# Patient Record
Sex: Male | Born: 1985 | Race: White | Hispanic: No | Marital: Married | Smoking: Never smoker
Health system: Southern US, Community
[De-identification: ages and names within clinical notes are randomized; demographics above are authoritative.]

## PROBLEM LIST (undated history)

## (undated) DIAGNOSIS — L511 Stevens-Johnson syndrome: Secondary | ICD-10-CM

## (undated) DIAGNOSIS — Z91013 Allergy to seafood: Secondary | ICD-10-CM

## (undated) DIAGNOSIS — T7840XA Allergy, unspecified, initial encounter: Secondary | ICD-10-CM

## (undated) HISTORY — DX: Allergy, unspecified, initial encounter: T78.40XA

---

## 2018-05-09 HISTORY — PX: VASECTOMY: SHX75

## 2020-05-03 ENCOUNTER — Ambulatory Visit: Payer: BC Managed Care – PPO | Admitting: Physician Assistant

## 2020-05-03 ENCOUNTER — Other Ambulatory Visit: Payer: Self-pay

## 2020-05-03 VITALS — BP 113/77 | HR 91 | Temp 98.3°F | Resp 18 | Ht 67.5 in | Wt 126.0 lb

## 2020-05-03 DIAGNOSIS — J302 Other seasonal allergic rhinitis: Secondary | ICD-10-CM | POA: Diagnosis not present

## 2020-05-03 DIAGNOSIS — R059 Cough, unspecified: Secondary | ICD-10-CM

## 2020-05-03 DIAGNOSIS — R05 Cough: Secondary | ICD-10-CM

## 2020-05-03 MED ORDER — BENZONATATE 100 MG PO CAPS: 100.0000 mg | ORAL_CAPSULE | Freq: Three times a day (TID) | ORAL | 0 refills | Status: AC | PRN

## 2020-05-03 NOTE — Patient Instructions (Signed)
I encourage you to use Zyrtec or Claritin over-the-counter on a daily basis, generic versions are fine.  Continue to use Flonase as needed, you can use it twice a day for the next few days.  I did send a refill of the Tessalon Perles to your pharmacy  Please let us know if there is anything else that we can do for you  Roney Jaffe, PA-C Physician Assistant [facility] [email or web address]    Allergies, Adult An allergy is when your body's defense system (immune system) overreacts to an otherwise harmless substance (allergen) that you breathe in or eat or something that touches your skin. When you come into contact with something that you are allergic to, your immune system produces certain proteins (antibodies). These proteins cause cells to release chemicals (histamines) that trigger the symptoms of an allergic reaction. Allergies often affect the nasal passages (allergic rhinitis), eyes (allergic conjunctivitis), skin (atopic dermatitis), and stomach. Allergies can be mild or severe. Allergies cannot spread from person to person (are not contagious). They can develop at any age and may be outgrown. What increases the risk? You may be at greater risk of allergies if other people in your family have allergies. What are the signs or symptoms? Symptoms depend on what type of allergy you have. They may include:  Runny, stuffy nose.  Sneezing.  Itchy mouth, ears, or throat.  Postnasal drip.  Sore throat.  Itchy, red, watery, or puffy eyes.  Skin rash or hives.  Stomach pain.  Vomiting.  Diarrhea.  Bloating.  Wheezing or coughing. People with a severe allergy to food, medicine, or an insect bite may have a life-threatening allergic reaction (anaphylaxis). Symptoms of anaphylaxis include:  Hives.  Itching.  Flushed face.  Swollen lips, tongue, or mouth.  Tight or swollen throat.  Chest pain or tightness in the  chest.  Trouble breathing or shortness of breath.  Rapid heartbeat.  Dizziness or fainting.  Vomiting.  Diarrhea.  Pain in the abdomen. How is this diagnosed? This condition is diagnosed based on:  Your symptoms.  Your family and medical history.  A physical exam. You may need to see a health care provider who specializes in treating allergies (allergist). You may also have tests, including:  Skin tests to see which allergens are causing your symptoms, such as: ? Skin prick test. In this test, your skin is pricked with a tiny needle and exposed to small amounts of possible allergens to see if your skin reacts. ? Intradermal skin test. In this test, a small amount of allergen is injected under your skin to see if your skin reacts. ? Patch test. In this test, a small amount of allergen is placed on your skin and then your skin is covered with a bandage. Your health care provider will check your skin after a couple of days to see if a rash has developed.  Blood tests.  Challenges tests. In this test, you inhale a small amount of allergen by mouth to see if you have an allergic reaction. You may also be asked to:  Keep a food diary. A food diary is a record of all the foods and drinks you have in a day and any symptoms you experience.  Practice an elimination diet. An elimination diet involves eliminating specific foods from your diet and then adding them back in one by one to find out if a certain food causes an allergic reaction. How is this treated? Treatment for allergies depends on your  symptoms. Treatment may include:  Cold compresses to soothe itching and swelling.  Eye drops.  Nasal sprays.  Using a saline spray or container (neti pot) to flush out the nose (nasal irrigation). These methods can help clear away mucus and keep the nasal passages moist.  Using a humidifier.  Oral antihistamines or other medicines to block allergic reaction and inflammation.  Skin  creams to treat rashes or itching.  Diet changes to eliminate food allergy triggers.  Repeated exposure to tiny amounts of allergens to build up a tolerance and prevent future allergic reactions (immunotherapy). These include: ? Allergy shots. ? Oral treatment. This involves taking small doses of an allergen under the tongue (sublingual immunotherapy).  Emergency epinephrine injection (auto-injector) in case of an allergic emergency. This is a self-injectable, pre-measured medicine that must be given within the first few minutes of a serious allergic reaction. Follow these instructions at home:         Avoid known allergens whenever possible.  If you suffer from airborne allergens, wash out your nose daily. You can do this with a saline spray or a neti pot to flush out your nose (nasal irrigation).  Take over-the-counter and prescription medicines only as told by your health care provider.  Keep all follow-up visits as told by your health care provider. This is important.  If you are at risk of a severe allergic reaction (anaphylaxis), keep your auto-injector with you at all times.  If you have ever had anaphylaxis, wear a medical alert bracelet or necklace that states you have a severe allergy. Contact a health care provider if:  Your symptoms do not improve with treatment. Get help right away if:  You have symptoms of anaphylaxis, such as: ? Swollen mouth, tongue, or throat. ? Pain or tightness in your chest. ? Trouble breathing or shortness of breath. ? Dizziness or fainting. ? Severe abdominal pain, vomiting, or diarrhea. This information is not intended to replace advice given to you by your health care provider. Make sure you discuss any questions you have with your health care provider. Document Revised: 11/04/2017 Document Reviewed: 02/27/2016 Elsevier Patient Education  2020 [employer].

## 2020-05-03 NOTE — Progress Notes (Signed)
Patient complains of intermittent cough returning after being treated for allergies the end of July. Patient shares the cough increases at night with laying down and is non productive.

## 2020-05-03 NOTE — Progress Notes (Signed)
New Patient Office Visit  Subjective:  Patient ID: David Sims, male    DOB: September 29, 1985  Age: 34 y.o. MRN: [id number]  CC:  Chief Complaint  Patient presents with  . Cough    HPI David Sims reports that he has been having a cough for the past two months, was seen in UC and given an inhaler, Prednisone and Tessalon perles with some relief.  Reports cough continues, is worse at night.  Non-productive.  Denies any other URI sxs.  Negative Covid test 04/24/20.  States that cough came back a couple of days ago Has been using flonase.  Did take Zyrtec for a little while with some relief. Recently moved from [place] , stayed in a camp [place] about a week in [facility]  March 2021 Moderna fully vaccinated  History reviewed. No pertinent past medical history.  History reviewed. No pertinent surgical history.  History reviewed. No pertinent family history.  Social History   Socioeconomic History  . Marital status: Unknown    Spouse name: Not on file  . Number of children: Not on file  . Years of education: Not on file  . Highest education level: Not on file  Occupational History  . Not on file  Tobacco Use  . Smoking status: Never Smoker  . Smokeless tobacco: Never Used  Substance and Sexual Activity  . Alcohol use: Not on file  . Drug use: Not on file  . Sexual activity: Not on file  Other Topics Concern  . Not on file  Social History Narrative  . Not on file   Social Determinants of Health   Financial Resource Strain:   . Difficulty of Paying Living Expenses: Not on file  Food Insecurity:   . Worried About Programme researcher, broadcasting/film/video in the Last Year: Not on file  . Ran Out of Food in the Last Year: Not on file  Transportation Needs:   . Lack of Transportation (Medical): Not on file  . Lack of Transportation (Non-Medical): Not on file  Physical Activity:   . Days of Exercise per Week: Not on file  . Minutes of Exercise per Session: Not on file  Stress:   . Feeling of Stress : Not  on file  Social Connections:   . Frequency of Communication with Friends and Family: Not on file  . Frequency of Social Gatherings with Friends and Family: Not on file  . Attends Religious Services: Not on file  . Active Member of Clubs or Organizations: Not on file  . Attends Banker Meetings: Not on file  . Marital Status: Not on file  Intimate Partner Violence:   . Fear of Current or Ex-Partner: Not on file  . Emotionally Abused: Not on file  . Physically Abused: Not on file  . Sexually Abused: Not on file    ROS Review of Systems  Constitutional: Negative for chills, fatigue and fever.  HENT: Positive for postnasal drip. Negative for congestion, sinus pressure, sinus pain and sore throat.   Eyes: Negative for itching.  Respiratory: Positive for cough. Negative for shortness of breath and wheezing.   Cardiovascular: Negative.   Gastrointestinal: Negative.   Endocrine: Negative.   Genitourinary: Negative.   Musculoskeletal: Negative.   Skin: Negative.   Allergic/Immunologic: Negative.   Neurological: Negative.   Hematological: Negative.   Psychiatric/Behavioral: Negative.     Objective:   Today's Vitals: BP 113/77 (BP Location: Left Arm, Patient Position: Sitting, Cuff Size: Normal)   Pulse 91  Temp 98.3 F (36.8 C) (Oral)   Resp 18   Ht 5' 7.5" (1.715 m)   Wt 126 lb (57.2 kg)   SpO2 100%   BMI 19.44 kg/m   Physical Exam Vitals and nursing note reviewed.  Constitutional:      Appearance: Normal appearance.  HENT:     Head: Normocephalic.     Right Ear: Hearing, tympanic membrane, ear canal and external ear normal.     Left Ear: Hearing, tympanic membrane, ear canal and external ear normal.     Nose:     Right Turbinates: Enlarged and pale.     Left Turbinates: Enlarged and pale.     Right Sinus: No maxillary sinus tenderness or frontal sinus tenderness.     Left Sinus: No maxillary sinus tenderness or frontal sinus tenderness.      Mouth/Throat:     Lips: Pink.     Mouth: Mucous membranes are moist.     Pharynx: Uvula swelling present. No posterior oropharyngeal erythema.   Cardiovascular:     Pulses: Normal pulses.     Heart sounds: Normal heart sounds.  Pulmonary:     Effort: Pulmonary effort is normal.     Breath sounds: Normal breath sounds. No wheezing.  Musculoskeletal:        General: Normal range of motion.     Cervical back: Normal range of motion and neck supple.  Lymphadenopathy:     Cervical: No cervical adenopathy.  Skin:    General: Skin is warm and dry.  Neurological:     General: No focal deficit present.     Mental Status: He is alert and oriented to person, place, and time.  Psychiatric:        Mood and Affect: Mood normal.        Behavior: Behavior normal.        Thought Content: Thought content normal.        Judgment: Judgment normal.     Assessment & Plan:   Problem List Items Addressed This Visit    None    Visit Diagnoses    Cough    -  Primary   Seasonal allergies          Outpatient Encounter Medications as of 05/03/2020  Medication Sig  . albuterol (VENTOLIN HFA) 108 (90 Base) MCG/ACT inhaler SMARTSIG:2 Puff(s) By Mouth Every Night PRN  . benzonatate (TESSALON) 100 MG capsule Take 1 capsule (100 mg total) by mouth 3 (three) times daily as needed for up to 10 days.  . valACYclovir (VALTREX) 1000 MG tablet Take 2,000 mg by mouth 2 (two) times daily.  . [DISCONTINUED] benzonatate (TESSALON) 100 MG capsule Take 100 mg by mouth 3 (three) times daily as needed.  . [DISCONTINUED] predniSONE (DELTASONE) 20 MG tablet Take 20 mg by mouth 2 (two) times daily.   No facility-administered encounter medications on file as of 05/03/2020.  1. Cough Refilled Tessalon Perles, encourage patient to take Zyrtec or Claritin over-the-counter on a daily basis, continue Flonase as needed.  Red flags given for prompt evaluation at ED.  2. Seasonal allergies    I have reviewed the patient's  medical history (PMH, PSH, Social History, Family History, Medications, and allergies) , and have been updated if relevant. I spent 30 minutes reviewing chart and  face to face time with patient.      Follow-up: Return if symptoms worsen or fail to improve.   Kasandra Knudsen Mayers, PA-C

## 2020-05-16 ENCOUNTER — Emergency Department (INDEPENDENT_AMBULATORY_CARE_PROVIDER_SITE_OTHER): Payer: BC Managed Care – PPO

## 2020-05-16 ENCOUNTER — Other Ambulatory Visit: Payer: Self-pay

## 2020-05-16 ENCOUNTER — Emergency Department (INDEPENDENT_AMBULATORY_CARE_PROVIDER_SITE_OTHER)
Admission: RE | Admit: 2020-05-16 | Discharge: 2020-05-16 | Disposition: A | Discharge status: Home / Self Care | Payer: BC Managed Care – PPO | Source: Ambulatory Visit | Attending: Family Medicine | Admitting: Family Medicine

## 2020-05-16 VITALS — BP 122/83 | HR 94 | Temp 98.6°F | Resp 16 | Ht 67.0 in | Wt 125.0 lb

## 2020-05-16 DIAGNOSIS — J069 Acute upper respiratory infection, unspecified: Secondary | ICD-10-CM | POA: Diagnosis not present

## 2020-05-16 DIAGNOSIS — R05 Cough: Secondary | ICD-10-CM | POA: Diagnosis not present

## 2020-05-16 HISTORY — DX: Allergy to seafood: Z91.013

## 2020-05-16 HISTORY — DX: Stevens-Johnson syndrome: L51.1

## 2020-05-16 MED ORDER — PREDNISONE 20 MG PO TABS: ORAL_TABLET | ORAL | 0 refills | Status: DC

## 2020-05-16 NOTE — ED Provider Notes (Signed)
[facility]    CSN: [id number] Arrival date & time: 05/16/20  0954      History   Chief Complaint Chief Complaint  Patient presents with  . Cough    HPI David Sims is a 34 y.o. male.   Patient has environmental allergies and reports that he has had a persistent cough for 2 months after moving here from [place].  His symptoms are usually somewhat controlled with Zyrtec, Claritin, and Flonase. Last night he developed sore throat, nasal drainage, and tightness in his anterior chest.  He denies fever and shortness of breath. He had the Moderna COVID vaccine 3/21.  The history is provided by the patient.    Past Medical History:  Diagnosis Date  . Seafood allergy   . Stevens-Johnson syndrome (HCC)    34 years old    There are no problems to display for this patient.   History reviewed. No pertinent surgical history.     Home Medications    Prior to Admission medications   Medication Sig Start Date End Date Taking? Authorizing Provider  cetirizine (ZYRTEC) 10 MG tablet Take 10 mg by mouth daily.   Yes [provider]  fluticasone (FLONASE) 50 MCG/ACT nasal spray Place into both nostrils daily.   Yes [provider]  albuterol (VENTOLIN HFA) 108 (90 Base) MCG/ACT inhaler SMARTSIG:2 Puff(s) By Mouth Every Night PRN 03/24/20   [provider]  predniSONE (DELTASONE) 20 MG tablet Take one tab by mouth twice daily for 4 days, then one daily for 3 days. Take with food. 05/16/20   Lattie Haw, MD  valACYclovir (VALTREX) 1000 MG tablet Take 2,000 mg by mouth 2 (two) times daily. 03/24/20   [provider]    Family History Family History  Problem Relation Age of Onset  . Hypertension Mother   . Cancer Paternal Grandmother        liver    Social History Social History   Tobacco Use  . Smoking status: Never Smoker  . Smokeless tobacco: Never Used  Vaping Use  . Vaping Use: Never used  Substance Use Topics  .  Alcohol use: Never  . Drug use: Never     Allergies   Penicillins   Review of Systems Review of Systems + sore throat + cough No pleuritic pain but has tightness in his anterior chest. No wheezing + nasal congestion + post-nasal drainage No sinus pain/pressure No itchy/red eyes No earache No hemoptysis No SOB No fever/chills No nausea No vomiting No abdominal pain No diarrhea No urinary symptoms No skin rash No fatigue No myalgias No headache Used OTC meds (Mucinex syrup) without relief   Physical Exam Triage Vital Signs ED Triage Vitals  Enc Vitals Group     BP 05/16/20 1014 122/83     Pulse Rate 05/16/20 1014 94     Resp 05/16/20 1014 16     Temp 05/16/20 1014 98.6 F (37 C)     Temp Source 05/16/20 1014 Oral     SpO2 05/16/20 1014 100 %     Weight 05/16/20 1010 125 lb (56.7 kg)     Height 05/16/20 1010 5\' 7"  (1.702 m)     Head Circumference --      Peak Flow --      Pain Score 05/16/20 1010 0     Pain Loc --      Pain Edu? --      Excl. in GC? --    No data  found.  Updated Vital Signs BP 122/83 (BP Location: Right Arm)   Pulse 94   Temp 98.6 F (37 C) (Oral)   Resp 16   Ht 5\' 7"  (1.702 m)   Wt 56.7 kg   SpO2 100%   BMI 19.58 kg/m   Visual Acuity Right Eye Distance:   Left Eye Distance:   Bilateral Distance:    Right Eye Near:   Left Eye Near:    Bilateral Near:     Physical Exam Nursing notes and Vital Signs reviewed. Appearance:  Patient appears stated age, and in no acute distress Eyes:  Pupils are equal, round, and reactive to light and accomodation.  Extraocular movement is intact.  Conjunctivae are not inflamed  Ears:  Canals normal.  Tympanic membranes normal.  Nose:  Mildly congested turbinates.  No sinus tenderness.  Pharynx:  Normal Neck:  Supple.  Mildly enlarged lateral nodes are present, tender to palpation on the left.   Lungs:  Clear to auscultation.  Breath sounds are equal.  Moving air well. Heart:  Regular rate  and rhythm without murmurs, rubs, or gallops.  Abdomen:  Nontender without masses or hepatosplenomegaly.  Bowel sounds are present.  No CVA or flank tenderness.  Extremities:  No edema.  Skin:  No rash present.   UC Treatments / Results  Labs (all labs ordered are listed, but only abnormal results are displayed) Labs Reviewed - No data to display  EKG   Radiology DG Chest 2 View  Result Date: 05/16/2020 CLINICAL DATA:  Cough and allergies for 2 months. EXAM: CHEST - 2 VIEW COMPARISON:  None. FINDINGS: Normal heart size and mediastinal contours. No acute infiltrate or edema. No effusion or pneumothorax. No acute osseous findings. IMPRESSION: No active cardiopulmonary disease. Electronically Signed   By: 05/18/2020 M.D.   On: 05/16/2020 11:37    Procedures Procedures (including critical care time)  Medications Ordered in UC Medications - No data to display  Initial Impression / Assessment and Plan / UC Course  I have reviewed the triage vital signs and the nursing notes.  Pertinent labs & imaging results that were available during my care of the patient were reviewed by me and considered in my medical decision making (see chart for details).    Negative chest x-ray reassuring. Benign exam.  There is no evidence of bacterial infection today.  Treat symptomatically for now  Begin prednisone burst/taper. Followup with Family Doctor if not improved in 7 to 10 days.   Final Clinical Impressions(s) / UC Diagnoses   Final diagnoses:  Viral URI with cough     Discharge Instructions     Take plain guaifenesin (1200mg  extended release tabs such as Mucinex) twice daily, with plenty of water, for cough and congestion.  May add Pseudoephedrine (30mg , one or two every 4 to 6 hours) for sinus congestion.  Get adequate rest.   May use Afrin nasal spray (or generic oxymetazoline) each morning for about 5 days and then discontinue.  Also recommend using saline nasal spray several times  daily and saline nasal irrigation (AYR is a common brand).  Use Flonase nasal spray each morning after using Afrin nasal spray and saline nasal irrigation. Try warm salt water gargles for sore throat.  Stop all antihistamines (Zyrtec, etc) for now, and other non-prescription cough/cold preparations. May take Tylenol as needed for sore throat, body aches, etc. May take Delsym Cough Suppressant ("12 Hour Cough Relief") at bedtime for nighttime cough.  ED Prescriptions    Medication Sig Dispense Auth. Provider   predniSONE (DELTASONE) 20 MG tablet Take one tab by mouth twice daily for 4 days, then one daily for 3 days. Take with food. 11 tablet Lattie Haw, MD        Lattie Haw, MD 05/19/20 [phone]

## 2020-05-16 NOTE — ED Triage Notes (Addendum)
Pt c/o cough and allergies x 2 months after moving here recently from [place]. Taking Zyrtec and flonase. Moderna Covid vaccine 3/21'.

## 2020-05-16 NOTE — Discharge Instructions (Addendum)
Take plain guaifenesin (1200mg  extended release tabs such as Mucinex) twice daily, with plenty of water, for cough and congestion.  May add Pseudoephedrine (30mg , one or two every 4 to 6 hours) for sinus congestion.  Get adequate rest.   May use Afrin nasal spray (or generic oxymetazoline) each morning for about 5 days and then discontinue.  Also recommend using saline nasal spray several times daily and saline nasal irrigation (AYR is a common brand).  Use Flonase nasal spray each morning after using Afrin nasal spray and saline nasal irrigation. Try warm salt water gargles for sore throat.  Stop all antihistamines (Zyrtec, etc) for now, and other non-prescription cough/cold preparations. May take Tylenol as needed for sore throat, body aches, etc. May take Delsym Cough Suppressant ("12 Hour Cough Relief") at bedtime for nighttime cough.

## 2020-10-10 ENCOUNTER — Ambulatory Visit: Payer: BC Managed Care – PPO | Admitting: Nurse Practitioner

## 2020-10-15 ENCOUNTER — Other Ambulatory Visit: Payer: Self-pay

## 2020-10-16 ENCOUNTER — Encounter: Payer: Self-pay | Admitting: Family Medicine

## 2020-10-16 ENCOUNTER — Ambulatory Visit (INDEPENDENT_AMBULATORY_CARE_PROVIDER_SITE_OTHER): Payer: BC Managed Care – PPO | Admitting: Family Medicine

## 2020-10-16 VITALS — BP 116/78 | HR 95 | Temp 97.0°F | Ht 68.0 in | Wt 129.2 lb

## 2020-10-16 DIAGNOSIS — Z1321 Encounter for screening for nutritional disorder: Secondary | ICD-10-CM | POA: Diagnosis not present

## 2020-10-16 DIAGNOSIS — Z13220 Encounter for screening for lipoid disorders: Secondary | ICD-10-CM | POA: Diagnosis not present

## 2020-10-16 DIAGNOSIS — J302 Other seasonal allergic rhinitis: Secondary | ICD-10-CM | POA: Diagnosis not present

## 2020-10-16 DIAGNOSIS — Z87442 Personal history of urinary calculi: Secondary | ICD-10-CM | POA: Insufficient documentation

## 2020-10-16 DIAGNOSIS — E559 Vitamin D deficiency, unspecified: Secondary | ICD-10-CM

## 2020-10-16 DIAGNOSIS — Z029 Encounter for administrative examinations, unspecified: Secondary | ICD-10-CM

## 2020-10-16 DIAGNOSIS — Z8619 Personal history of other infectious and parasitic diseases: Secondary | ICD-10-CM

## 2020-10-16 DIAGNOSIS — J45909 Unspecified asthma, uncomplicated: Secondary | ICD-10-CM | POA: Diagnosis not present

## 2020-10-16 NOTE — Progress Notes (Addendum)
[facility] [address] [place] [address] Dept: [phone] Dept Fax: [phone]  New Patient Office Visit  Subjective:    Patient ID: David Sims, male    DOB: [birth year], 35 y.o..   MRN: [id number]  Chief Complaint  Patient presents with  . Establish Care    New patient/establish care, no concerns. CPE    History of Present Illness:  Patient is in today to establish care. Mr. Petrich serves in the [employer]. He is married with 3 children (7,4,2 yo). He moved to [facility] form [place] last June. In addition to establishing care, he needed to have a physical form completed for the [employer].  Mr. Hofman notes that after he moved here last June, he suddenly developed intense allergy symptoms. He had not had these previously. He was treated with an albuterol inhaler, an oral course of steroids, cetrizine, and Flonase. He is now asymptomatic, but has the meds (other than prednisone) available should he flare.  Mr. Dworkin notes he has issues with a low Vit. D in the past. He states he gets reactions to sunlight, usually intense burns, so uses sunscreen liberally. He feels this has contributed to his Vit. D issue.  Past Medical History: Patient Active Problem List   Diagnosis Date Noted  . Seasonal allergies 10/16/2020  . Asthma due to seasonal allergies 10/16/2020  . History of kidney stones 10/16/2020  . History of shingles 10/16/2020   Past Surgical History:  Procedure Laterality Date  . VASECTOMY  05/09/2018   Family History  Problem Relation Age of Onset  . Hypertension Mother   . Cancer Paternal Grandmother        liver  . Dementia Paternal Grandmother   . Cancer Maternal Grandmother   . Heart disease Maternal Grandfather   . Heart disease Paternal Grandfather    Outpatient Medications Prior to Visit  Medication Sig Dispense Refill  . albuterol (VENTOLIN HFA) 108 (90 Base) MCG/ACT inhaler  SMARTSIG:2 Puff(s) By Mouth Every Night PRN    . cetirizine (ZYRTEC) 10 MG tablet Take 10 mg by mouth daily.    . fluticasone (FLONASE) 50 MCG/ACT nasal spray Place into both nostrils daily.    . valACYclovir (VALTREX) 1000 MG tablet Take 2,000 mg by mouth 2 (two) times daily. (Patient not taking: Reported on 10/16/2020)    . predniSONE (DELTASONE) 20 MG tablet Take one tab by mouth twice daily for 4 days, then one daily for 3 days. Take with food. (Patient not taking: Reported on 10/16/2020) 11 tablet 0   No facility-administered medications prior to visit.   Allergies  Allergen Reactions  . Penicillins    Objective:   Today's Vitals   10/16/20 1514  BP: 116/78  Pulse: 95  Temp: (!) 97 F (36.1 C)  TempSrc: Temporal  SpO2: 95%  Weight: 129 lb 3.2 oz (58.6 kg)  Height: 5\' 8"  (1.727 m)   Body mass index is 19.64 kg/m.   General: Well developed, well nourished. No acute distress. HEENT: Normocephalic, non-traumatic. PERRL, EOMI. Conjunctiva clear. Fundiscopic exam shows   normal disc and vasculature. External ears normal. EAC and TMs normal bilaterally. Nose clear without congestion or rhinorrhea. Mucous membranes moist. Oropharynx clear. Good dentition. Neck: Supple. No lymphadenopathy. No thyromegaly. Lungs: Clear to auscultation bilaterally. CV: RRR without murmurs or rubs. Pulses 2+ bilaterally. Abdomen: Soft, non-tender. No hepatosplenomegaly. No rebound or guarding. Extremities: Full ROM. No joint swelling or tenderness. No edema noted. Skin: Warm  and dry. No rashes. Psych: Alert and oriented. Normal mood and affect.  Health Maintenance Due  Topic Date Due  . Hepatitis C Screening  Never done  . HIV Screening  Never done     Assessment & Plan:   1. Seasonal allergies We discussed that Mr. Pretty may have a seasonal flare again in the late spring. He could opt either to await symptoms to restart cetirizine and Flonase or he could start using these earlier in May on  through to July.  2. Asthma due to seasonal allergies No wheezing at present. May use albuterol if allergies flare again this year.  3. Screening for lipid disorders  - Lipid panel  4. Encounter for vitamin deficiency screening  - VITAMIN D 25 Hydroxy (Vit-D Deficiency, Fractures)  5. History of shingles Notes this happened twice in the past. He was previously prescribed Valtrex for these flares. Will monitor for now.  Loyola Mast, MD

## 2020-10-17 ENCOUNTER — Encounter: Payer: Self-pay | Admitting: Family Medicine

## 2020-10-17 DIAGNOSIS — E559 Vitamin D deficiency, unspecified: Secondary | ICD-10-CM | POA: Insufficient documentation

## 2020-10-17 LAB — LIPID PANEL
Cholesterol: 200 mg/dL (ref 0–200)
HDL: 64.1 mg/dL (ref >39.00)
LDL Cholesterol: 125 mg/dL — ABNORMAL HIGH (ref 0–99)
NonHDL: 135.47
Total CHOL/HDL Ratio: 3
Triglycerides: 50 mg/dL (ref 0.0–149.0)
VLDL: 10 mg/dL (ref 0.0–40.0)

## 2020-10-17 LAB — VITAMIN D 25 HYDROXY (VIT D DEFICIENCY, FRACTURES): VITD: 21.33 ng/mL — ABNORMAL LOW (ref 30.00–100.00)

## 2020-10-17 MED ORDER — VITAMIN D (ERGOCALCIFEROL) 1.25 MG (50000 UNIT) PO CAPS
50000.0000 [IU] | ORAL_CAPSULE | ORAL | 0 refills | Status: DC
Start: 2020-10-17 — End: 2021-04-23

## 2021-04-15 ENCOUNTER — Encounter: Payer: Self-pay | Admitting: Family Medicine

## 2021-04-20 IMAGING — DX DG CHEST 2V
2 series · 2 of 2 positions shown · non-contrast
Comparison: None.

CLINICAL DATA: Cough and allergies for 2 months.

EXAM:
CHEST - 2 VIEW

[chest pa]
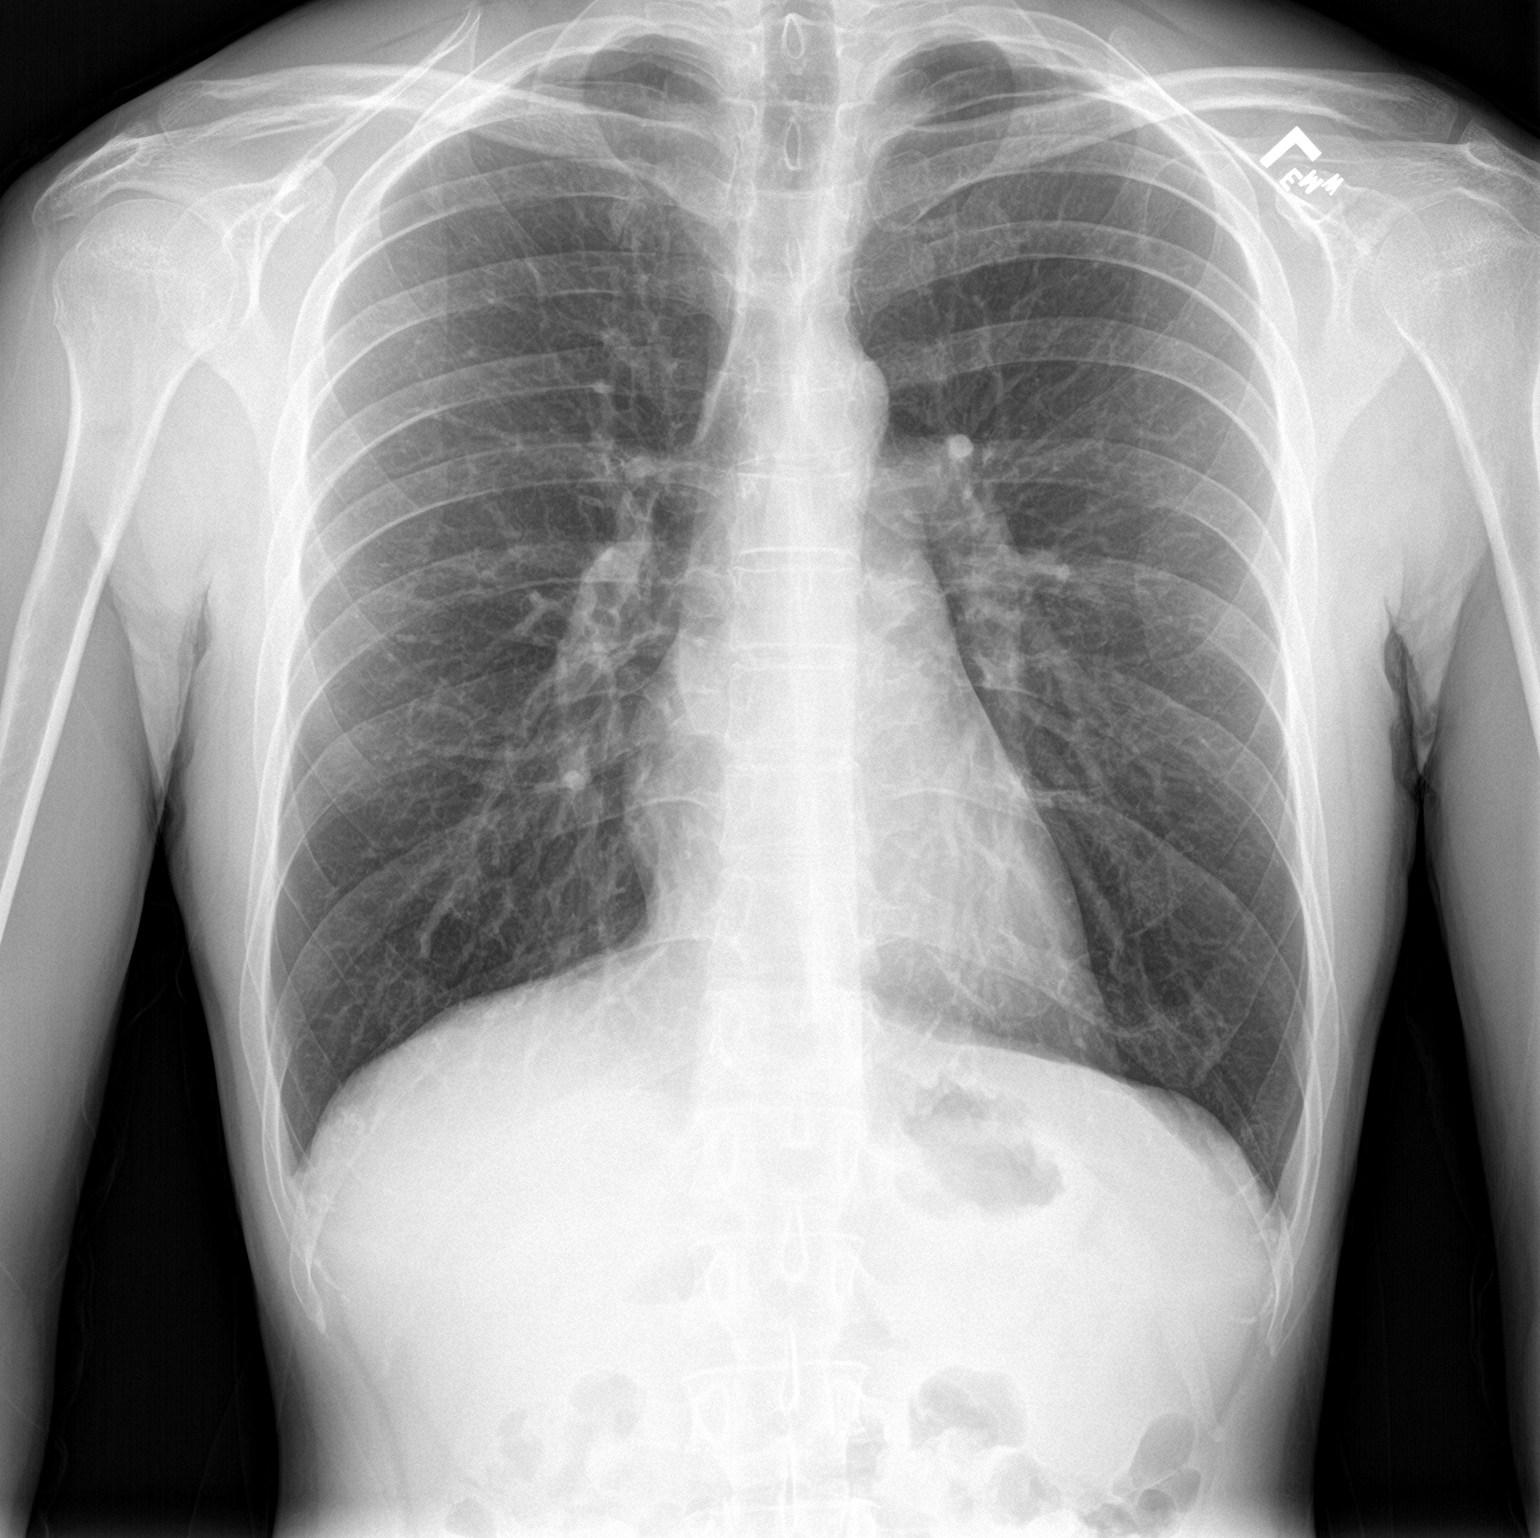

[chest lat]
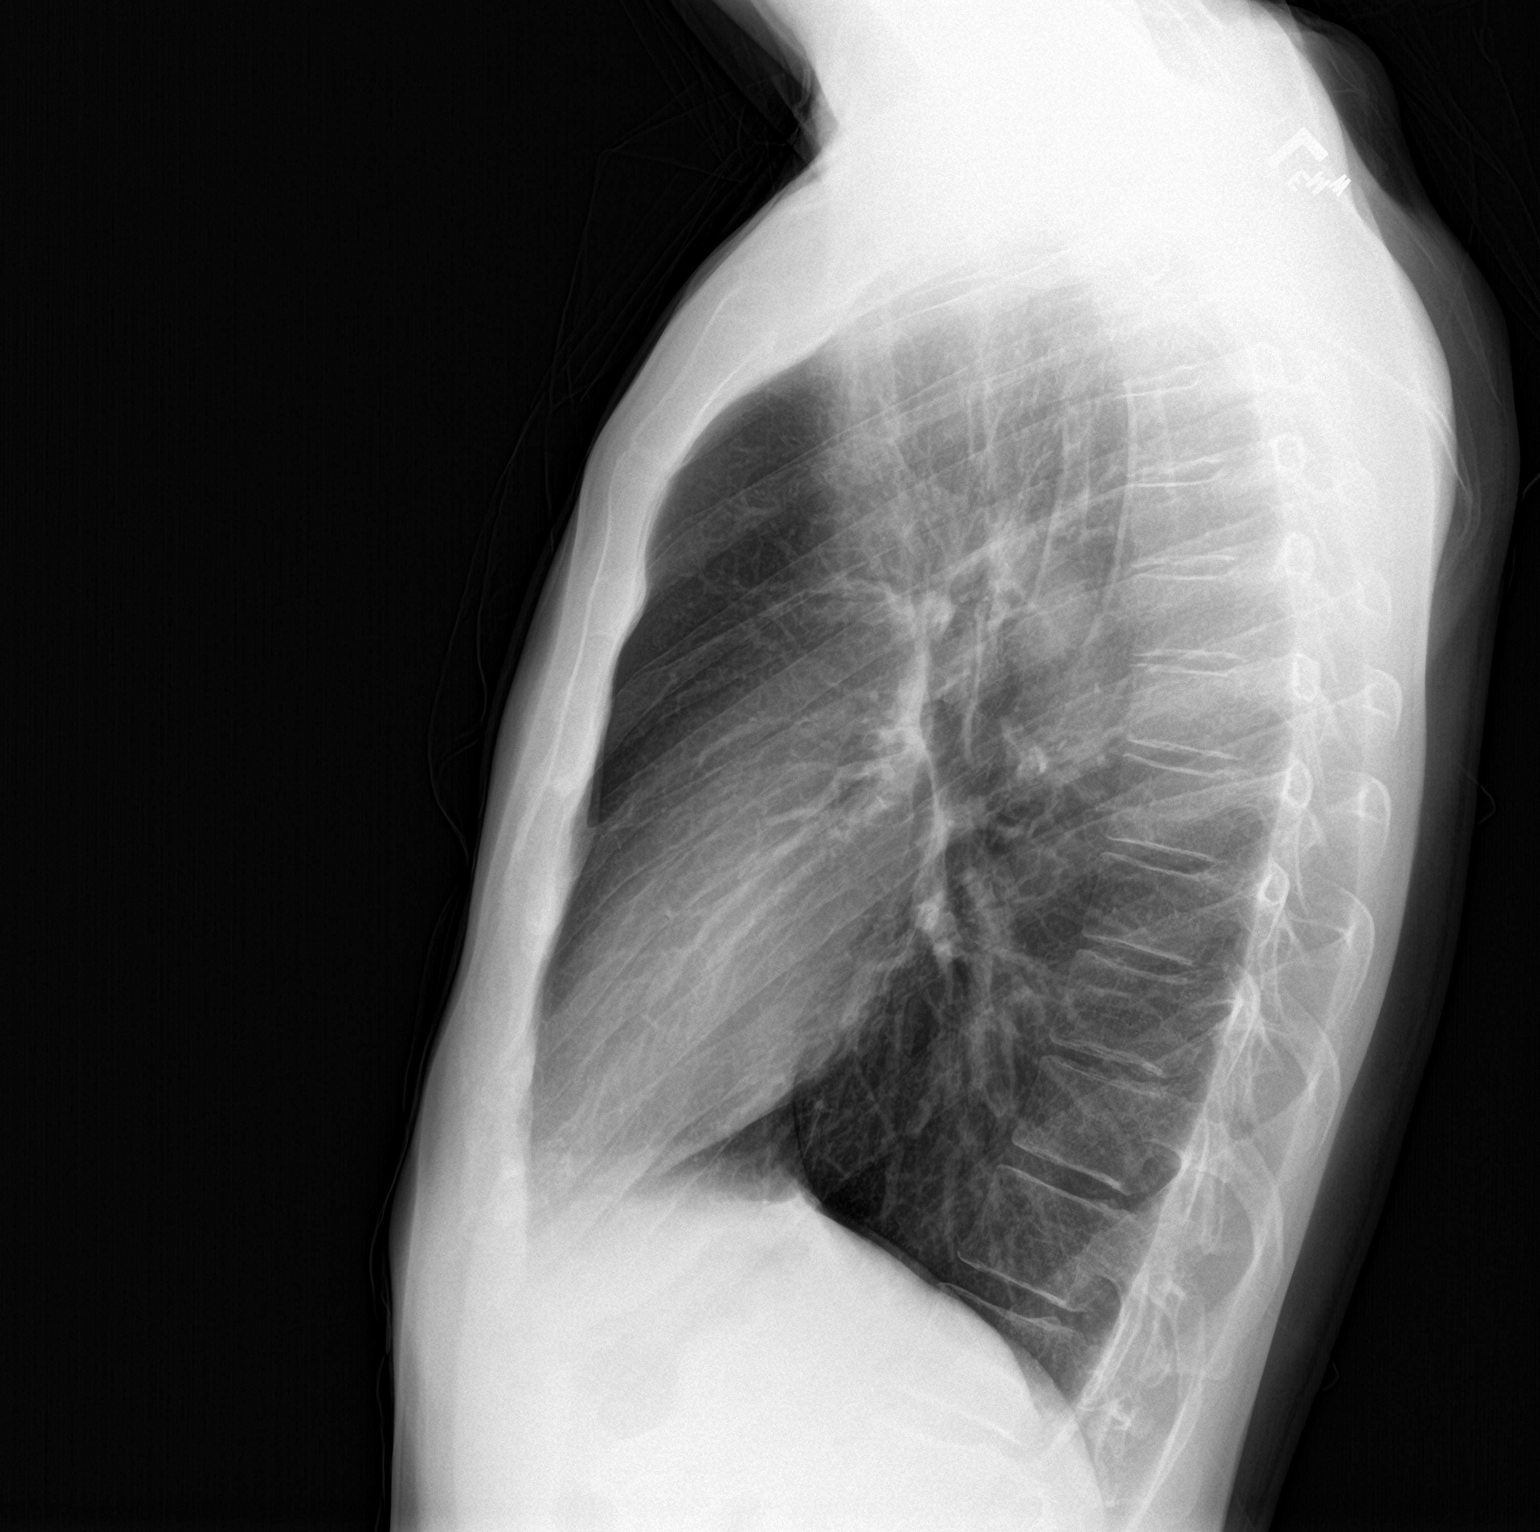

[2 of 2 positions shown; findings below may reference images not displayed]

FINDINGS: Normal heart size and mediastinal contours. No acute infiltrate or
edema. No effusion or pneumothorax. No acute osseous findings.
IMPRESSION: No active cardiopulmonary disease.

## 2021-04-23 ENCOUNTER — Ambulatory Visit (INDEPENDENT_AMBULATORY_CARE_PROVIDER_SITE_OTHER): Payer: BC Managed Care – PPO | Admitting: Family Medicine

## 2021-04-23 ENCOUNTER — Encounter: Payer: Self-pay | Admitting: Family Medicine

## 2021-04-23 ENCOUNTER — Other Ambulatory Visit: Payer: Self-pay

## 2021-04-23 VITALS — BP 106/68 | HR 83 | Temp 97.7°F | Ht 68.0 in | Wt 128.2 lb

## 2021-04-23 DIAGNOSIS — H93233 Hyperacusis, bilateral: Secondary | ICD-10-CM

## 2021-04-23 DIAGNOSIS — H6983 Other specified disorders of Eustachian tube, bilateral: Secondary | ICD-10-CM

## 2021-04-23 MED ORDER — FLUTICASONE PROPIONATE 50 MCG/ACT NA SUSP: 1.0000 | Freq: Two times a day (BID) | NASAL | 1 refills | Status: DC

## 2021-04-23 NOTE — Progress Notes (Signed)
[facility] [address] [place] [address] Dept: [phone] Dept Fax: [phone]  Office Visit  Subjective:    Patient ID: David Sims, male    DOB: [birth year], 35 y.o..   MRN: [id number]  Chief Complaint  Patient presents with   Hearing Problem    Pt c/o hearing sensitivity, along with headache x 2 years    History of Present Illness:  Patient is in today with a complaint of gradually worsening hearing issues. He notes he has been having a recurring issue with headache associated with loud noises. He has young girls at home and finds that their cries or shrieks can cause an immediate sharp pain int he head, which is relieved when he is away from the noise. He has also more recently noted that conversational sounds are muffled. He denies any dizziness. He has some intermittent tinnitus, which has not been bothersome. He has had occasional rhinitis symptoms, though these are typically worse in the spring. He has used antihistamines and nasal steroid sprays in the past, but none currently.  Past Medical History: Patient Active Problem List   Diagnosis Date Noted   Vitamin D deficiency 10/17/2020   Seasonal allergies 10/16/2020   Asthma due to seasonal allergies 10/16/2020   History of kidney stones 10/16/2020   History of shingles 10/16/2020   Past Surgical History:  Procedure Laterality Date   VASECTOMY  05/09/2018   Family History  Problem Relation Age of Onset   Hypertension Mother    Cancer Paternal Grandmother        liver   Dementia Paternal Grandmother    Cancer Maternal Grandmother    Heart disease Maternal Grandfather    Heart disease Paternal Grandfather    Outpatient Medications Prior to Visit  Medication Sig Dispense Refill   cetirizine (ZYRTEC) 10 MG tablet Take 10 mg by mouth daily.     albuterol (VENTOLIN HFA) 108 (90 Base) MCG/ACT inhaler SMARTSIG:2 Puff(s) By Mouth Every Night PRN (Patient not  taking: Reported on 04/23/2021)     fluticasone (FLONASE) 50 MCG/ACT nasal spray Place into both nostrils daily. (Patient not taking: Reported on 04/23/2021)     valACYclovir (VALTREX) 1000 MG tablet Take 2,000 mg by mouth 2 (two) times daily. (Patient not taking: Reported on 10/16/2020)     Vitamin D, Ergocalciferol, (DRISDOL) 1.25 MG (50000 UNIT) CAPS capsule Take 1 capsule (50,000 Units total) by mouth every 7 (seven) days. 8 capsule 0   No facility-administered medications prior to visit.   Allergies  Allergen Reactions   Penicillins    Objective:   Today's Vitals   04/23/21 1541  BP: 106/68  Pulse: 83  Temp: 97.7 F (36.5 C)  TempSrc: Temporal  SpO2: 98%  Weight: 128 lb 3.2 oz (58.2 kg)  Height: 5\' 8"  (1.727 m)   Body mass index is 19.49 kg/m.   General: Well developed, well nourished. No acute distress.  HEENT: Normocephalic, non-traumatic. Hearing grossly normal. External ears normal. EAC normal   bilaterally. TMs are mildly dull in appearance and may have some clear fluid behind the drum. The   turbinates are mildly swollen, but not much drainage. Mucous membranes moist. There is some   mucous streaking of the posterior oropharynx. Good dentition. Neck: Supple. No lymphadenopathy. No thyromegaly. Psych: Alert and oriented. Normal mood and affect.  Health Maintenance Due  Topic Date Due   HIV Screening  Never done   Hepatitis C Screening  Never done  INFLUENZA VACCINE  03/25/2021     Assessment & Plan:   1. Dysfunction of both eustachian tubes It appears there may be some issue with drainage from the eustachian tubes. I recommend we try a 1 month trial of Flonase to reduce nasal swelling and improve drainage. I will see him back if his issue is not improving.  - fluticasone (FLONASE) 50 MCG/ACT nasal spray; Place 1 spray into both nostrils in the morning and at bedtime.  Dispense: 15.8 mL; Refill: 1  2. Hyperacusis of both ears It is not clear if the hyperacusis  is related to the Eustachian issue or not. I recommend we see if this resolves with the Flonase. If not, we will consider further assessment.  Loyola Mast, MD

## 2021-10-17 ENCOUNTER — Encounter: Payer: BC Managed Care – PPO | Admitting: Family Medicine

## 2023-03-17 ENCOUNTER — Ambulatory Visit: Payer: BC Managed Care – PPO | Admitting: Family Medicine

## 2023-03-17 ENCOUNTER — Encounter: Payer: Self-pay | Admitting: Family Medicine

## 2023-03-17 VITALS — BP 116/70 | HR 90 | Temp 98.7°F | Ht 68.0 in | Wt 136.0 lb

## 2023-03-17 DIAGNOSIS — Z0000 Encounter for general adult medical examination without abnormal findings: Secondary | ICD-10-CM | POA: Diagnosis not present

## 2023-03-17 NOTE — Progress Notes (Signed)
[facility] [address] [place] [address] Dept: [phone] Dept Fax: [phone]  Annual Physical Visit  Subjective:    Patient ID: David Sims, male    DOB: October 03, 1985, 37 y.o..   MRN: [id number]  Chief Complaint  Patient presents with   Annual Exam    CPE/labs.  No concerns.     History of Present Illness:  Patient is in today for an annual physical/preventative visit.  Review of Systems  Constitutional:  Negative for chills, diaphoresis, fever, malaise/fatigue and weight loss.  HENT:  Negative for congestion, ear pain, hearing loss, sinus pain, sore throat and tinnitus.        Currently receiving immunotherapy for allergies. This has made a big difference in reducing his symptoms.  Eyes:  Negative for blurred vision, pain, discharge and redness.  Respiratory:  Negative for cough, shortness of breath and wheezing.   Cardiovascular:  Negative for chest pain and palpitations.  Gastrointestinal:  Positive for heartburn. Negative for abdominal pain, constipation, diarrhea, nausea and vomiting.       Infrequent heartburn, managed with Tums.  Musculoskeletal:  Positive for joint pain. Negative for back pain and myalgias.       Episodic right knee pain which he tolerates well.  Skin:  Negative for itching and rash.  Psychiatric/Behavioral:  Negative for depression. The patient is not nervous/anxious.    Past Medical History: Patient Active Problem List   Diagnosis Date Noted   Vitamin D deficiency 10/17/2020   Seasonal allergies 10/16/2020   Asthma due to seasonal allergies 10/16/2020   History of kidney stones 10/16/2020   History of shingles 10/16/2020   Past Surgical History:  Procedure Laterality Date   VASECTOMY  05/09/2018   Family History  Problem Relation Age of Onset   Hypertension Mother    Cancer Paternal Grandmother        liver   Dementia Paternal Grandmother    Cancer Maternal Grandmother     Heart disease Maternal Grandfather    Heart disease Paternal Grandfather    Outpatient Medications Prior to Visit  Medication Sig Dispense Refill   cetirizine (ZYRTEC) 10 MG tablet Take 10 mg by mouth daily.     fluticasone (FLONASE) 50 MCG/ACT nasal spray Place 1 spray into both nostrils in the morning and at bedtime. 15.8 mL 1   No facility-administered medications prior to visit.   Allergies  Allergen Reactions   Penicillins    Objective:   Today's Vitals   03/17/23 1510  BP: 116/70  Pulse: 90  Temp: 98.7 F (37.1 C)  TempSrc: Temporal  SpO2: 98%  Weight: 136 lb (61.7 kg)  Height: 5\' 8"  (1.727 m)   Body mass index is 20.68 kg/m.   General: Well developed, well nourished. No acute distress. HEENT: Normocephalic, non-traumatic. PERRL, EOMI. Conjunctiva clear. External ears normal. EAC and TMs   normal bilaterally. Nose clear without congestion or rhinorrhea. Mucous membranes moist. Mild posterior   cobblestoning. Oropharynx clear. Good dentition. Neck: Supple. No lymphadenopathy. No thyromegaly. Lungs: Clear to auscultation bilaterally. No wheezing, rales or rhonchi. CV: RRR without murmurs or rubs. Pulses 2+ bilaterally. Abdomen: Soft, non-tender. Bowel sounds positive, normal pitch and frequency. No hepatosplenomegaly. No   rebound or guarding. Extremities: Full ROM. No joint swelling or tenderness. No edema noted. Skin: Warm and dry. No rashes. Psych: Alert and oriented. Normal mood and affect.  Health Maintenance Due  Topic Date Due   HIV Screening  Never done  Hepatitis C Screening  Never done     Assessment & Plan:   Problem List Items Addressed This Visit   None Visit Diagnoses     Annual physical exam    -  Primary   Overall excellent health. UTD on immunizaitons and screenings. I recommend he include 150 minutes/wk of moderate-intensity exercise.       No follow-ups on file.   Loyola Mast, MD

## 2023-10-27 ENCOUNTER — Encounter: Payer: BC Managed Care – PPO | Admitting: Family Medicine
